# Patient Record
Sex: Male | Born: 1970 | Race: White | Hispanic: No | Marital: Married
Health system: Southern US, Community
[De-identification: ages and names within clinical notes are randomized; demographics above are authoritative.]

---

## 2006-12-28 ENCOUNTER — Ambulatory Visit: Payer: Self-pay | Admitting: Family Medicine

## 2006-12-28 DIAGNOSIS — J45909 Unspecified asthma, uncomplicated: Secondary | ICD-10-CM | POA: Insufficient documentation

## 2006-12-28 DIAGNOSIS — F17200 Nicotine dependence, unspecified, uncomplicated: Secondary | ICD-10-CM | POA: Insufficient documentation

## 2006-12-28 DIAGNOSIS — M549 Dorsalgia, unspecified: Secondary | ICD-10-CM | POA: Insufficient documentation

## 2006-12-28 DIAGNOSIS — G43909 Migraine, unspecified, not intractable, without status migrainosus: Secondary | ICD-10-CM | POA: Insufficient documentation

## 2007-01-07 ENCOUNTER — Telehealth: Payer: Self-pay | Admitting: Family Medicine

## 2007-02-02 ENCOUNTER — Encounter: Payer: Self-pay | Admitting: Family Medicine

## 2007-02-12 ENCOUNTER — Encounter: Payer: Self-pay | Admitting: Family Medicine

## 2007-03-01 ENCOUNTER — Encounter: Admission: RE | Admit: 2007-03-01 | Discharge: 2007-03-01 | Payer: Self-pay | Admitting: Neurosurgery

## 2007-03-24 ENCOUNTER — Encounter: Payer: Self-pay | Admitting: Family Medicine

## 2007-04-09 ENCOUNTER — Inpatient Hospital Stay (HOSPITAL_COMMUNITY): Admission: RE | Admit: 2007-04-09 | Discharge: 2007-04-14 | Payer: Self-pay | Admitting: Neurosurgery

## 2007-04-22 ENCOUNTER — Encounter: Payer: Self-pay | Admitting: Family Medicine

## 2007-11-15 ENCOUNTER — Encounter: Payer: Self-pay | Admitting: Family Medicine

## 2007-11-16 ENCOUNTER — Telehealth (INDEPENDENT_AMBULATORY_CARE_PROVIDER_SITE_OTHER): Payer: Self-pay | Admitting: *Deleted

## 2008-03-07 ENCOUNTER — Ambulatory Visit: Payer: Self-pay | Admitting: Family Medicine

## 2008-03-07 DIAGNOSIS — F411 Generalized anxiety disorder: Secondary | ICD-10-CM | POA: Insufficient documentation

## 2008-03-17 ENCOUNTER — Ambulatory Visit: Payer: Self-pay | Admitting: Physical Medicine & Rehabilitation

## 2008-03-23 ENCOUNTER — Encounter
Admission: RE | Admit: 2008-03-23 | Discharge: 2008-04-26 | Payer: Self-pay | Admitting: Physical Medicine & Rehabilitation

## 2008-03-30 ENCOUNTER — Telehealth: Payer: Self-pay | Admitting: Family Medicine

## 2008-04-07 ENCOUNTER — Ambulatory Visit: Payer: Self-pay | Admitting: Physical Medicine & Rehabilitation

## 2008-05-05 ENCOUNTER — Ambulatory Visit: Payer: Self-pay | Admitting: Physical Medicine & Rehabilitation

## 2008-06-02 ENCOUNTER — Ambulatory Visit: Payer: Self-pay | Admitting: Physical Medicine & Rehabilitation

## 2008-06-05 ENCOUNTER — Encounter
Admission: RE | Admit: 2008-06-05 | Discharge: 2008-06-05 | Payer: Self-pay | Admitting: Physical Medicine & Rehabilitation

## 2008-06-28 ENCOUNTER — Encounter
Admission: RE | Admit: 2008-06-28 | Discharge: 2008-06-29 | Payer: Self-pay | Admitting: Physical Medicine & Rehabilitation

## 2008-06-29 ENCOUNTER — Ambulatory Visit: Payer: Self-pay | Admitting: Physical Medicine & Rehabilitation

## 2009-05-10 ENCOUNTER — Ambulatory Visit: Payer: Self-pay | Admitting: Family Medicine

## 2009-05-10 DIAGNOSIS — J209 Acute bronchitis, unspecified: Secondary | ICD-10-CM

## 2010-01-23 ENCOUNTER — Encounter: Payer: Self-pay | Admitting: Family Medicine

## 2010-01-29 ENCOUNTER — Encounter: Admission: RE | Admit: 2010-01-29 | Discharge: 2010-01-29 | Payer: Self-pay | Admitting: Neurosurgery

## 2010-03-04 ENCOUNTER — Encounter: Payer: Self-pay | Admitting: Family Medicine

## 2010-08-28 NOTE — Letter (Signed)
Summary: [facility]  [facility]   Imported By: Lanelle Bal 03/19/2010 11:44:49  _____________________________________________________________________  External Attachment:    Type:   Image     Comment:   External Document

## 2010-08-28 NOTE — Letter (Signed)
Summary: [facility]  [facility]   Imported By: Lanelle Bal 02/25/2010 12:33:05  _____________________________________________________________________  External Attachment:    Type:   Image     Comment:   External Document

## 2010-12-10 NOTE — Assessment & Plan Note (Signed)
[name] returns today.  A 40 year old male with lumbar post-  laminectomy syndrome and chronic postoperative pain following fusion at  L3-L4, L4-L5, and L5-S1 on April 09, 2007.  He had been doing quite  well increasing his level of activity.  He was off his narcotic  analgesics, just on tramadol.  He tried staying up all day on Monday to  be up for 8 hours to see if he can handle full-time work but this  resulted in an exacerbation.  He had a couple of main complaints, one is  that a divot next to his scar was causing pain with prolonged activity  and also pain going down his right lower extremity.  He has had no  falls. He has had no other trauma.   He has had some adjustment of his medications by his psychiatrist, Dr.  Paulino Rily.  He had no progression of pain over the course of the  week.  It has really stayed about the same, perhaps some better.  He  continues on Motrin 800 mg b.i.d., tramadol 1 p.o. t.i.d., as well as  gabapentin 400 mg t.i.d.   PHYSICAL EXAMINATION:  VITAL SIGNS:  Blood pressure 133/78, pulse 80,  and weight 200 pounds.  GENERAL:  Depressed-appearing male in no acute distress.  Gait is  normal.  He is able to toe walk and heel walk.  Orientation x3.  EXTREMITIES:  Without edema.  MUSCULOSKELETAL:  He has normal sensation in lower extremities.  Normal  deep tendon reflexes.  Normal coordination in lower extremities.  His  spine range of motion 50% forward flexion and extension.  Lower  extremity range of motion is normal.   IMPRESSION:  1. Lumbar post-laminectomy syndrome.  2. Chronic postoperative pain.  Overall has had a significant improvement but has had some exacerbation  of late.  I do not think he has really had any event that would lead me  to think of hardware failure.  Wife and the patient are a bit anxious  about it, particularly the wife.  I have basically written a  prescription for flexion and extension views that they can fill,  although the patient is not sure whether he actually he wants an x-ray  at this point.   I will see him back in about 1 month's time or sooner if he has any  progression.   In terms of his scar supersensitivity, I would trial some Lidoderm over  that area.  Scar desensitization might be an option as well.      [name], M.D.  Electronically Signed     AEK/MedQ  D:  06/02/2008 12:33:30  T:  06/03/2008 01:18:09  Job #:  [id number]   cc:   Payton Doughty, M.D.  Fax: [phone]   Collene Mares, M.D.  [phone]  [place], [place] [address]

## 2010-12-10 NOTE — Assessment & Plan Note (Signed)
HISTORY:  A 40 year old male with lumbar postlaminectomy syndrome and  chronic postoperative pain following fusion at  L3-L4, L4-L5, and L5-S1  done on April 09, 2007.  He has been doing quite well and increasing  his level of activity.  He is climbing steps again.  He uses a cane from  time to time.  His average pain is in the 3-4/10 range.  His current  pain is 2/10, interferes with activity at a moderate level.  His sleep  is good.  Pain is worse with standing and bending and improves with  pacing activities, medication, and TENS unit.  Relief from meds is good.   REVIEW OF SYSTEMS:  Positive for weakness and tingling in his lower  extremity, particularly right side, as well as depression and anxiety.  He does have some constipation.   MEDICATIONS:  He is on;  1. Tramadol 50 mg t.i.d., occasionally taking q.i.d.  2. Gabapentin 400 mg t.i.d.   PHYSICAL EXAMINATION:  GENERAL:  In no acute distress.  Mood and affect  appropriate.  VITAL SIGNS:  Blood pressure 133/68, pulse 79, respiratory rate 18, and  O2 sat 97% on room air.  Affect is alert.  Gait is normal.  EXTREMITIES:  Without edema.  Coordination normal.  Deep tendon reflexes  normal.  Sensation normal in the lower extremities.  He does have some  tenderness over the right PSIS area.  His range of motion in the lumbar  spine is 50% forward flexion and 25% extension.   IMPRESSION:  Lumbar postlaminectomy syndrome and chronic postoperative  pain following fusion at L3-L4, L4-L5, and L5-S1 levels.  He does have  some intermittent radicular pain, which appears to be in the right L4  distribution.  We discussed other treatments including transforaminal  epidural steroid injection, which he declines at this time.  He states  he had a bad experience with that injection in the past due to an  allergy to the contrast.   PLAN:  We will continue his Neurontin as well as his tramadol.  I will  see him back in about 2 months'  time.   Overall, I am quite pleased with his progress.  He is doing well off  narcotic analgesics and increasing his activity level.      Erick Colace, M.D.  Electronically Signed     AEK/MedQ  D:  06/29/2008 16:41:31  T:  06/30/2008 03:52:45  Job #:  [id number]   cc:   Nani Gasser, M.D.  [phone]  [place], [place] [address]   Payton Doughty, M.D.  Fax: [phone]
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
## 2010-12-10 NOTE — Op Note (Signed)
NAMEBARTLETT, ENKE             ACCOUNT NO.:  [id number]   MEDICAL RECORD NO.:  [id number]          PATIENT TYPE:  INP   LOCATION:  2852                         FACILITY:  [facility]   PHYSICIAN:  Payton Doughty, M.D.      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   PROCEDURES:  L3-4, L4-5, L5-S1 laminectomy and diskectomy, posterior  lumbar interbody fusion with Ray threaded fusion cages, segmental  pedicle screw fixation from L3-S1 and posterolateral arthrodesis L3-S1.   ANESTHESIA:  General endotracheal.   PREP:  Sterile Betadine prep and scrub with alcohol wipe.   COMPLICATIONS:  None.   NURSE ASSISTANT:  [place].   DOCTOR ASSISTANT:  Hilda Lias, M.D.   BODY OF TEXT:  This is a 40 year old gentleman with severe lumbar  spondylosis.  He has recurrent disk at L4-5, spondylosis at L3-4 and  spondylosis at L5-S1.  Taken to the operative room and smoothly  anesthetized and intubated, placed prone on the operating table.  Following shave, prep, and drape in usual sterile fashion, skin was  infiltrated with 1% lidocaine with 1:400,000 epinephrine.  The skin was  incised from the middle of L2 to the middle of S1.  The lamina and  transverse processes of L3, L4, L5 and the sacral ala were exposed  bilaterally in subperiosteal plane.  Intraoperative x-ray confirmed  correctness of the level.  Pars interarticularis lamina and inferior  facet of L3, L4 and L5 and superior facet of L4, L5 and S1 were removed  bilaterally using the high-speed drill and the bone set aside for  grafting.  On the left side at L4-L5, there has been a prior diskectomy,  and there was some recurrent disk that was removed without difficulty.  Right side had more  recurrent disk.  This was also removed without  difficulty.  This removal of ligamentum flavum and disk allowed  decompression of the roots as they traversed this area.  At 3-4, there  was significant degenerative change of facet arthropathy.  The 5-1 level  also facet arthropathy.  Following complete decompression, Ray threaded  fusion cages 12 x 21 mm at 3-4 and 14 x 21 mm at 4-5 and 5-1 were placed  bilaterally, packed with bone graft harvested from the facet joints.  Pedicle screws were then placed in L3-L4, L5-S1 bilaterally, and x-ray  confirmed good placement of screws.  They were attached to the rods,  locking caps tightened.  The transverse process and sacral ala were  decorticated with a high-speed drill and then packed with BMP on the  extender matrix.  Final x-ray showed good placement of the Ray cages,  rods and screws.  Successive layers of 0 Vicryl, 2-0 Vicryl and 3-0  nylon were used to close.  Betadine and Telfa dressing was applied and  made occlusive with OpSite.  The patient returned to the recovery  room  in good condition.    .           ______________________________  Payton Doughty, M.D.     MWR/MEDQ  D:  04/09/2007  T:  04/10/2007  Job:  [id number]

## 2010-12-10 NOTE — H&P (Signed)
NAMESCHYLAR, WUEBKER             ACCOUNT NO.:  [id number]   MEDICAL RECORD NO.:  [id number]          PATIENT TYPE:  INP   LOCATION:  2852                         FACILITY:  [facility]   PHYSICIAN:  Payton Doughty, M.D.      DATE OF BIRTH:  September 14, 1970   DATE OF ADMISSION:  04/09/2007  DATE OF DISCHARGE:                              HISTORY & PHYSICAL   ADMISSION DIAGNOSIS:  Spondylosis L3-4, L4-5 and L5-S1.   HISTORY OF PRESENT ILLNESS:  This is a 40 year old left-handed white  gentleman who has had 2 diskectomies, one on September 29, 1994 and one on  October 30, 2001, both for left leg pain.  In May of 2008, he slipped down  a pond bank, caught himself on a tree, twisted his back and has pain in  his back radiating  down his right leg.  It gets into his buttock and  the right thigh, outside the right calf and into the right heel and  reminiscent of S1 distribution.  The left side has been ineffective.  He  underwent an MRI.  Has a central disk recurrence at 4-5, spondylytic  change at both 3-4, 5-1  with a central disk at 5-1.  He is now admitted  for  a fusion at those levels.   PAST MEDICAL HISTORY:  Remarkable for depression.   MEDICATIONS:  He takes Cymbalta 60 mg a day, Fiorinal 1-2 on a p.r.n.  basis for migraines, Flexeril 10 mg for spasm, Lidoderm patches, Niravam  for anxiety and albuterol puff.   ALLERGIES:  HE IS ALLERGIC TO CONTRAST DYE.   SOCIAL HISTORY:  He smokes a pack and a half of cigarettes a day.  He  does not drink alcohol and is not working.   FAMILY HISTORY:  Mom is 2 in good health.  She had breast cancer and  depression and migraines.  Dad is 17, in good health with heart disease.  He has had back surgery and knee replacement.   REVIEW OF SYSTEMS:  Remarkable for leg pain while he is walking.  COPD,  back pain and depression.   PHYSICAL EXAMINATION:  HEENT exam:  Within normal limits.  NECK:  Has reasonable range of motion in the neck.  CHEST:  Clear.  CARDIAC  EXAM:  Regular rate and rhythm.  ABDOMEN:  Nontender.  There is no hepatosplenomegaly.  EXTREMITIES:  Without clubbing, cyanosis.  Peripheral pulses are good.  GU EXAM:  Deferred.  NEUROLOGICAL:  He is awake, alert and oriented.  His cranial nerves are  intact.  Motor exam shows 5/5 strength throughout the upper and lower  extremities.  Pulling bothers his back.  Sensitivity is described in the  right S1 distribution.  Reflexes are 1 at the left knee, 2 at the right,  2 at the left ankle, 1 at the right ankle.  Reverse straight leg raise  but not straight leg raise is positive for right hip and leg pain. He  has disk recurrence at 4-5, spondylytic change at both 3-4 and 5-1 with  a central disk at 5-1.  He had a prior  diskectomy at 3-4.   CLINICAL IMPRESSION:  Disk recurrence at 4-5 and new disk at 5-1.  He  has significant spondylosis at 3-4.   PLAN:  The plan is for 3-4, 4-5 and 5-1 laminectomy, diskectomy,  posterior interbody fusion, segmental pedicle  screw fixation and  posterolateral arthrodesis.  The risks and benefits of this approach  have been discussed with him.  He wishes to proceed.           ______________________________  Payton Doughty, M.D.     MWR/MEDQ  D:  04/09/2007  T:  04/09/2007  Job:  [id number]
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
## 2010-12-10 NOTE — Discharge Summary (Signed)
[name], [name]             ACCOUNT NO.:  [id number]   MEDICAL RECORD NO.:  [id number]          PATIENT TYPE:  INP   LOCATION:  3025                         FACILITY:  [facility]   PHYSICIAN:  Payton Doughty, M.D.      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   OPERATIVE PROCEDURE:  L3-4, L4-5 and L5-S1 laminectomy, diskectomy,  posterior lumbar interbody cages with segmental pedicle screw fixation  and posterior arthrodesis.   DICTATING DOCTOR:  Payton Doughty, M.D.   SURGEON:  Payton Doughty, M.D.   COMPLICATIONS:  None.   SUMMARY:  A 40 year old left-handed white gentleman whose history and  physical is recounted on the chart.  He has had 2 diskectomies in the  past and has had increasing pain, recurrence of disc in both L4-5 and L5-  S1 and is admitted for decompression.   PAST MEDICAL HISTORY:  Remarkable for depression.   ALLERGIES:  Contrast dye.   PHYSICAL EXAMINATION:  GENERAL  exam is intact.  NEUROLOGIC:  Exam is intact with a positive straight leg raise.   HOSPITAL COURSE:  He was admitted after ascertaining normal laboratory  values and underwent effusion as noted above.  Postoperative, he has  done well.  He was on a PCA for 3 days.  PCA was stopped yesterday.  He  is up eating and voiding normally.  He participated in physical therapy.  He is competent with his ADLs and getting his brace off and on.  He had  temperature up to 99.  Felt to be related to his history of smoking.  His  incision remains dry and well healed.  His strength is full.  He is  being discharged home in care of his family.  His followup will be in  the [facility] offices in about a week for suture removal.           ______________________________  Payton Doughty, M.D.     MWR/MEDQ  D:  04/14/2007  T:  04/14/2007  Job:  [id number]

## 2011-05-09 LAB — DIFFERENTIAL
Basophils Absolute: 0
Eosinophils Relative: 3
Lymphocytes Relative: 21
Lymphs Abs: 2.4
Monocytes Absolute: 0.9 — ABNORMAL HIGH
Neutrophils Relative %: 68

## 2011-05-09 LAB — APTT: aPTT: 30

## 2011-05-09 LAB — COMPREHENSIVE METABOLIC PANEL
ALT: 30
Alkaline Phosphatase: 72
BUN: 6
Sodium: 138

## 2011-05-09 LAB — URINALYSIS, ROUTINE W REFLEX MICROSCOPIC
Bilirubin Urine: NEGATIVE
Glucose, UA: NEGATIVE
Nitrite: NEGATIVE
Specific Gravity, Urine: 1.013
pH: 6

## 2011-05-09 LAB — CBC
MCV: 94.6
RBC: 4.92

## 2011-05-09 LAB — URINE MICROSCOPIC-ADD ON

## 2011-05-09 LAB — PROTIME-INR: INR: 0.9

## 2011-05-09 LAB — TYPE AND SCREEN
ABO/RH(D): A POS
Antibody Screen: NEGATIVE

## 2017-05-04 ENCOUNTER — Other Ambulatory Visit: Payer: Self-pay | Admitting: Physician Assistant

## 2017-05-04 DIAGNOSIS — M542 Cervicalgia: Secondary | ICD-10-CM

## 2017-05-20 ENCOUNTER — Ambulatory Visit
Admission: RE | Admit: 2017-05-20 | Discharge: 2017-05-20 | Disposition: A | Discharge status: Home / Self Care | Payer: Self-pay | Source: Ambulatory Visit | Attending: Physician Assistant | Admitting: Physician Assistant

## 2017-05-20 DIAGNOSIS — M542 Cervicalgia: Secondary | ICD-10-CM

## 2017-05-20 MED ORDER — TRIAMCINOLONE ACETONIDE 40 MG/ML IJ SUSP (RADIOLOGY)
60.0000 mg | Freq: Once | INTRAMUSCULAR | Status: AC
  Administered 2017-05-20: 60 mg via EPIDURAL

## 2017-05-20 MED ORDER — IOPAMIDOL (ISOVUE-M 200) INJECTION 41%: 1.0000 mL | Freq: Once | INTRAMUSCULAR | Status: DC

## 2017-05-20 NOTE — Discharge Instructions (Signed)

## 2017-06-30 ENCOUNTER — Other Ambulatory Visit: Payer: Self-pay | Admitting: Nurse Practitioner

## 2017-06-30 DIAGNOSIS — M5412 Radiculopathy, cervical region: Secondary | ICD-10-CM

## 2017-08-27 ENCOUNTER — Inpatient Hospital Stay
Admission: RE | Admit: 2017-08-27 | Discharge: 2017-08-27 | Disposition: A | Discharge status: Home / Self Care | Payer: Self-pay | Source: Ambulatory Visit | Attending: Nurse Practitioner | Admitting: Nurse Practitioner

## 2017-09-08 ENCOUNTER — Ambulatory Visit
Admission: RE | Admit: 2017-09-08 | Discharge: 2017-09-08 | Disposition: A | Discharge status: Home / Self Care | Payer: Self-pay | Source: Ambulatory Visit | Attending: Nurse Practitioner | Admitting: Nurse Practitioner

## 2017-09-08 DIAGNOSIS — M5412 Radiculopathy, cervical region: Secondary | ICD-10-CM

## 2017-09-08 MED ORDER — TRIAMCINOLONE ACETONIDE 40 MG/ML IJ SUSP (RADIOLOGY)
60.0000 mg | Freq: Once | INTRAMUSCULAR | Status: AC
  Administered 2017-09-08: 60 mg via EPIDURAL

## 2017-09-08 NOTE — Discharge Instructions (Signed)

## 2017-12-11 ENCOUNTER — Other Ambulatory Visit: Payer: Self-pay | Admitting: Neurosurgery

## 2017-12-11 DIAGNOSIS — M5412 Radiculopathy, cervical region: Secondary | ICD-10-CM

## 2017-12-17 ENCOUNTER — Other Ambulatory Visit: Payer: Self-pay

## 2017-12-17 ENCOUNTER — Ambulatory Visit
Admission: RE | Admit: 2017-12-17 | Discharge: 2017-12-17 | Disposition: A | Discharge status: Home / Self Care | Payer: Medicare Other | Source: Ambulatory Visit | Attending: Neurosurgery | Admitting: Neurosurgery

## 2017-12-17 DIAGNOSIS — M5412 Radiculopathy, cervical region: Secondary | ICD-10-CM

## 2019-03-13 IMAGING — CT CT CERVICAL SPINE W/O CM
2 series · 10 of 14 positions shown, 12 images · non-contrast
Comparison: None.

CLINICAL DATA: Chronic neck pain and bilateral arm pain and
numbness for the past year. Findings are worse on the right.

EXAM:
CT CERVICAL SPINE WITHOUT CONTRAST
TECHNIQUE: Multidetector CT imaging of the cervical spine was performed without
intravenous contrast. Multiplanar CT image reconstructions were also
generated.

[Series 3: cspine soft · axial · 0.35mm/px · z∈[-319,-171]mm · 5 of 112 slices shown]
[im 19/112  soft-tissue]
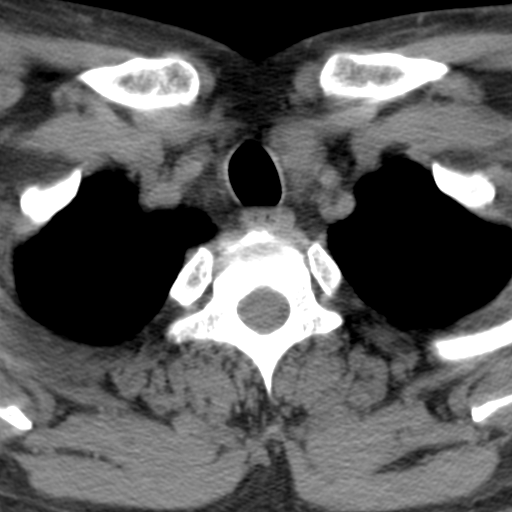
[im 38/112  soft-tissue]
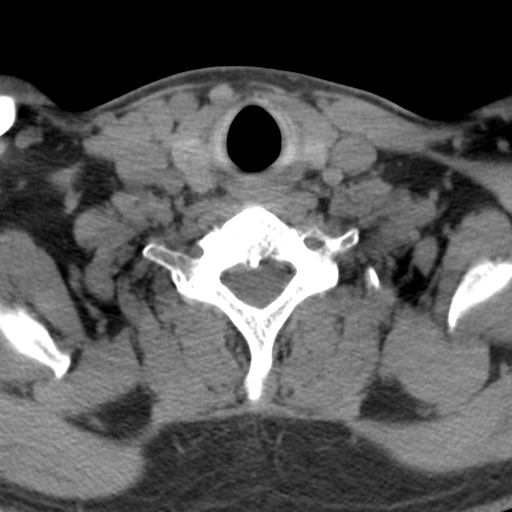
[im 56/112  soft-tissue]
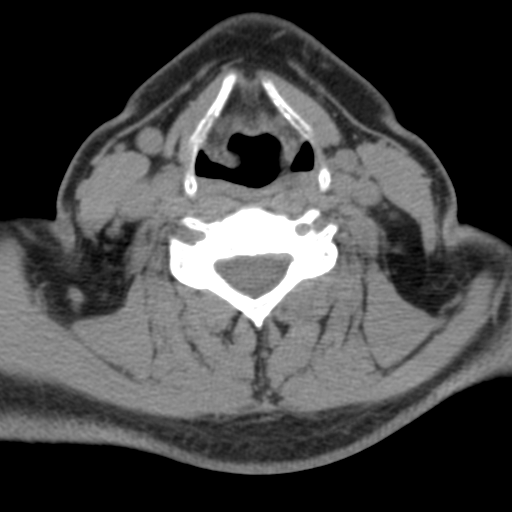
[im 75/112  soft-tissue]
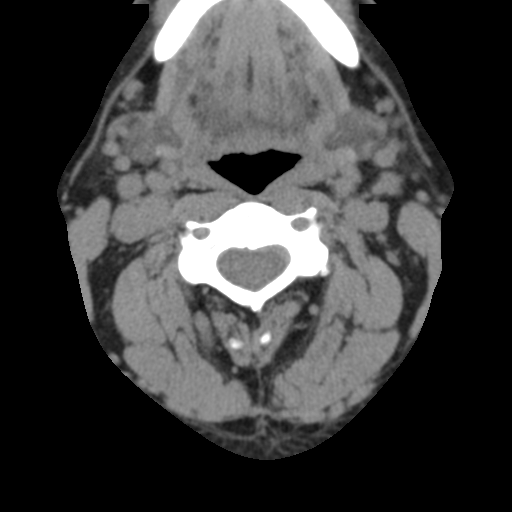
[im 93/112  soft-tissue]
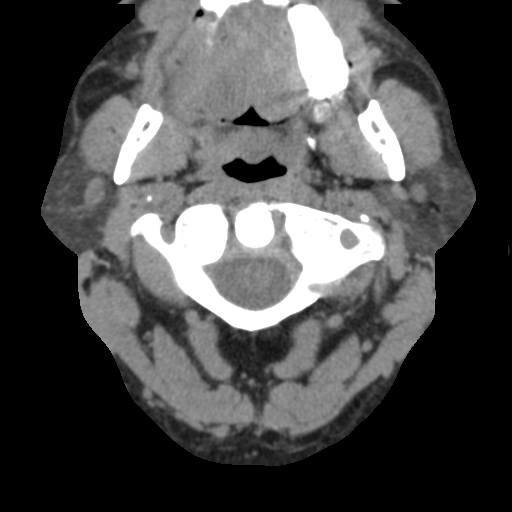

[Series 9: angled axial · axial · 0.34mm/px · z∈[-332,-175]mm · 5 of 119 slices shown, 7 images]
[im 20/119  soft-tissue]
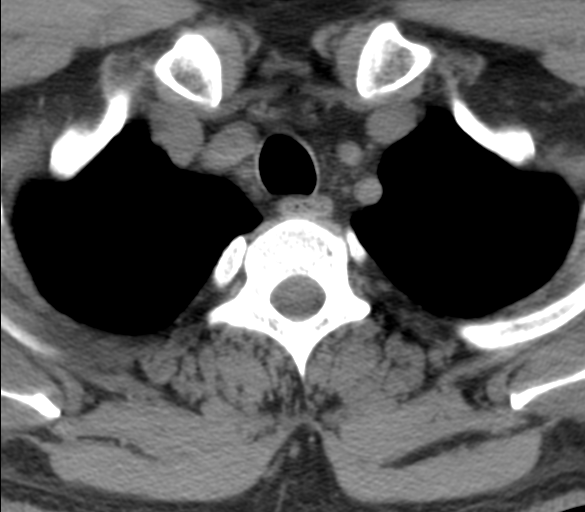
[im 20/119  bone]
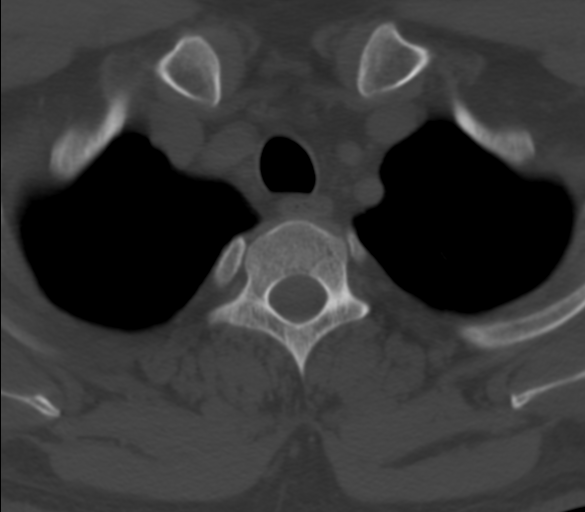
[im 40/119  bone]
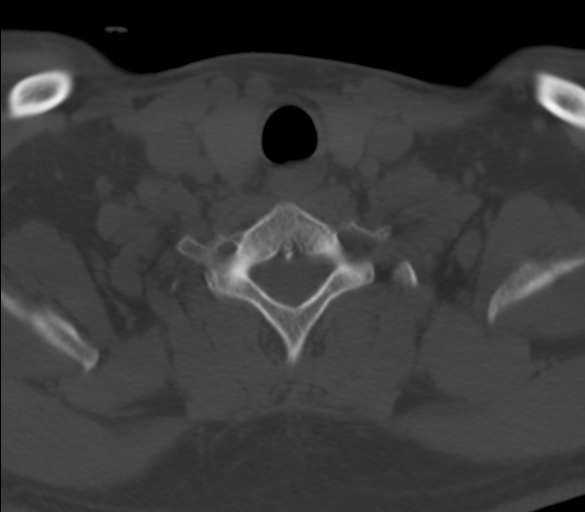
[im 60/119  bone]
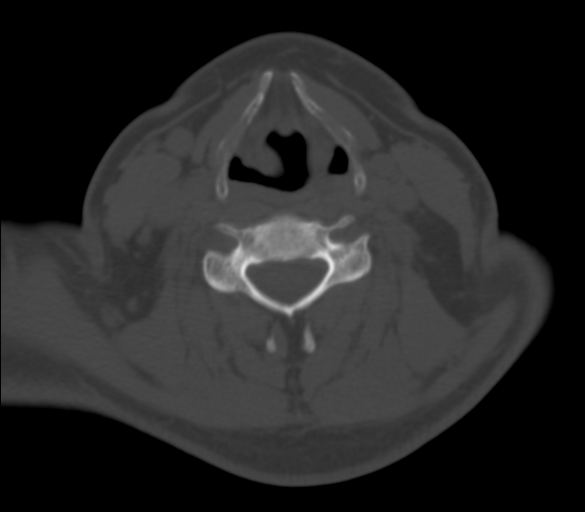
[im 79/119  bone]
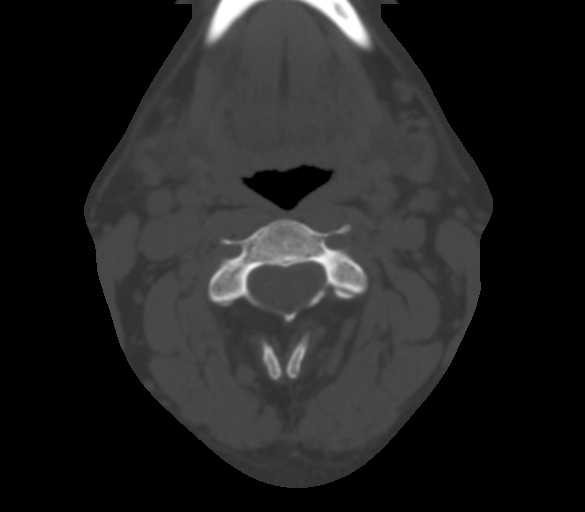
[im 99/119  soft-tissue]
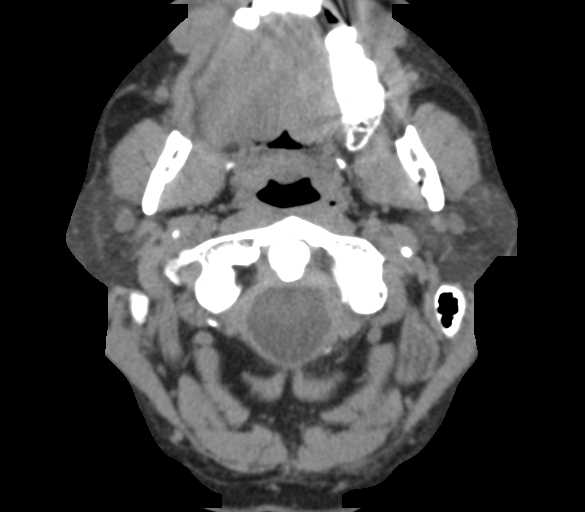
[im 99/119  bone]
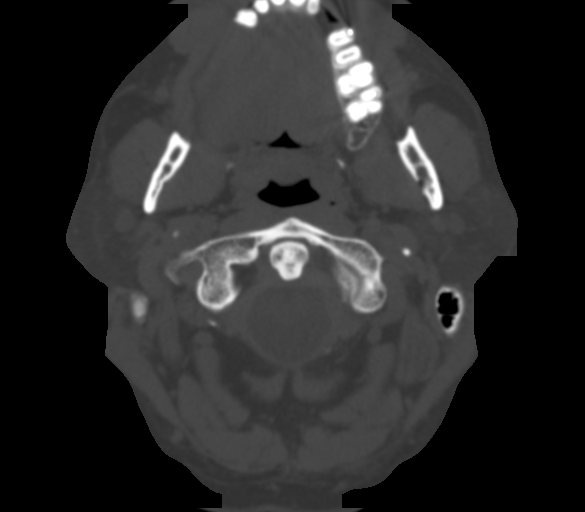

[10 of 14 positions shown; findings below may reference images not displayed]

FINDINGS: Alignment: Normal.

Skull base and vertebrae: No acute fracture. No primary bone lesion
or focal pathologic process.

Soft tissues and spinal canal: Normal prevertebral soft tissues and
visible spinal canal.

Disc levels:

C2-C3:  Negative.

C3-C4:  Negative.

C4-C5:  Negative.

C5-C6: Mild disc height loss and facet uncovertebral hypertrophy
resulting in mild bilateral neuroforaminal stenosis.

C6-C7: Mild disc height loss and moderate right and mild left
uncovertebral hypertrophy resulting in moderate right and mild left
neuroforaminal stenosis.

C7-T1:  Negative.

Upper chest: Negative.

Other: Mucous retention cysts in the bilateral maxillary sinuses..
IMPRESSION: 1. Degenerative changes at C5-C6 and C6-C7. Moderate right
neuroforaminal stenosis at C6-C7.
